# Patient Record
Sex: Female | Born: 1955 | Hispanic: No | Marital: Married
Health system: Southern US, Community
[De-identification: ages and names within clinical notes are randomized; demographics above are authoritative.]

---

## 2020-06-12 ENCOUNTER — Emergency Department (HOSPITAL_COMMUNITY)
Admission: EM | Admit: 2020-06-12 | Discharge: 2020-06-13 | Disposition: A | Discharge status: Home / Self Care | Payer: 59 | Attending: Emergency Medicine | Admitting: Emergency Medicine

## 2020-06-12 DIAGNOSIS — R52 Pain, unspecified: Secondary | ICD-10-CM

## 2020-06-12 DIAGNOSIS — Y999 Unspecified external cause status: Secondary | ICD-10-CM | POA: Diagnosis not present

## 2020-06-12 DIAGNOSIS — W19XXXA Unspecified fall, initial encounter: Secondary | ICD-10-CM

## 2020-06-12 DIAGNOSIS — S22060A Wedge compression fracture of T7-T8 vertebra, initial encounter for closed fracture: Secondary | ICD-10-CM | POA: Insufficient documentation

## 2020-06-12 DIAGNOSIS — N3000 Acute cystitis without hematuria: Secondary | ICD-10-CM | POA: Diagnosis not present

## 2020-06-12 DIAGNOSIS — R519 Headache, unspecified: Secondary | ICD-10-CM | POA: Insufficient documentation

## 2020-06-12 DIAGNOSIS — S299XXA Unspecified injury of thorax, initial encounter: Secondary | ICD-10-CM | POA: Diagnosis present

## 2020-06-12 DIAGNOSIS — Y9389 Activity, other specified: Secondary | ICD-10-CM | POA: Insufficient documentation

## 2020-06-12 DIAGNOSIS — E871 Hypo-osmolality and hyponatremia: Secondary | ICD-10-CM | POA: Insufficient documentation

## 2020-06-12 DIAGNOSIS — Y92002 Bathroom of unspecified non-institutional (private) residence single-family (private) house as the place of occurrence of the external cause: Secondary | ICD-10-CM | POA: Diagnosis not present

## 2020-06-12 DIAGNOSIS — W010XXA Fall on same level from slipping, tripping and stumbling without subsequent striking against object, initial encounter: Secondary | ICD-10-CM | POA: Insufficient documentation

## 2020-06-12 NOTE — ED Triage Notes (Signed)
Onset 4pm pt was walking out of BR, Fell backwards hitting back of head and back.  C/o head, back, upper left abd pain.  NO LOC, dizziness.  Pt not sure why she fell.

## 2020-06-13 ENCOUNTER — Emergency Department (HOSPITAL_COMMUNITY): Payer: 59

## 2020-06-13 LAB — URINALYSIS, ROUTINE W REFLEX MICROSCOPIC
Bilirubin Urine: NEGATIVE
Glucose, UA: NEGATIVE mg/dL
Hgb urine dipstick: NEGATIVE
Ketones, ur: NEGATIVE mg/dL
Nitrite: NEGATIVE
Protein, ur: NEGATIVE mg/dL
Specific Gravity, Urine: 1.011 (ref 1.005–1.030)
pH: 7 (ref 5.0–8.0)

## 2020-06-13 LAB — COMPREHENSIVE METABOLIC PANEL
ALT: 23 U/L (ref 0–44)
AST: 25 U/L (ref 15–41)
Albumin: 3.8 g/dL (ref 3.5–5.0)
Alkaline Phosphatase: 105 U/L (ref 38–126)
Anion gap: 11 (ref 5–15)
BUN: 12 mg/dL (ref 8–23)
CO2: 26 mmol/L (ref 22–32)
Calcium: 9.7 mg/dL (ref 8.9–10.3)
Chloride: 92 mmol/L — ABNORMAL LOW (ref 98–111)
Creatinine, Ser: 0.53 mg/dL (ref 0.44–1.00)
GFR calc Af Amer: >60 mL/min (ref >60)
GFR calc non Af Amer: >60 mL/min (ref >60)
Glucose, Bld: 113 mg/dL — ABNORMAL HIGH (ref 70–99)
Potassium: 3.7 mmol/L (ref 3.5–5.1)
Sodium: 129 mmol/L — ABNORMAL LOW (ref 135–145)
Total Bilirubin: 0.3 mg/dL (ref 0.3–1.2)
Total Protein: 7.5 g/dL (ref 6.5–8.1)

## 2020-06-13 LAB — CBC WITH DIFFERENTIAL/PLATELET
Abs Immature Granulocytes: 0.03 10*3/uL (ref 0.00–0.07)
Basophils Absolute: 0 10*3/uL (ref 0.0–0.1)
Basophils Relative: 0 %
Eosinophils Absolute: 0.1 10*3/uL (ref 0.0–0.5)
Eosinophils Relative: 1 %
HCT: 40 % (ref 36.0–46.0)
Hemoglobin: 13.3 g/dL (ref 12.0–15.0)
Immature Granulocytes: 0 %
Lymphocytes Relative: 31 %
Lymphs Abs: 3.3 10*3/uL (ref 0.7–4.0)
MCH: 30.5 pg (ref 26.0–34.0)
MCHC: 33.3 g/dL (ref 30.0–36.0)
MCV: 91.7 fL (ref 80.0–100.0)
Monocytes Absolute: 0.9 10*3/uL (ref 0.1–1.0)
Monocytes Relative: 9 %
Neutro Abs: 6.4 10*3/uL (ref 1.7–7.7)
Neutrophils Relative %: 59 %
Platelets: 400 10*3/uL (ref 150–400)
RBC: 4.36 MIL/uL (ref 3.87–5.11)
RDW: 12.8 % (ref 11.5–15.5)
WBC: 10.7 10*3/uL — ABNORMAL HIGH (ref 4.0–10.5)
nRBC: 0 % (ref 0.0–0.2)

## 2020-06-13 MED ORDER — ONDANSETRON 4 MG PO TBDP: 4.0000 mg | ORAL_TABLET | Freq: Three times a day (TID) | ORAL | 0 refills | Status: AC | PRN

## 2020-06-13 MED ORDER — OXYCODONE-ACETAMINOPHEN 5-325 MG PO TABS
1.0000 | ORAL_TABLET | Freq: Once | ORAL | Status: AC
  Administered 2020-06-13: 1 via ORAL
  Filled 2020-06-13: qty 1

## 2020-06-13 MED ORDER — OXYCODONE-ACETAMINOPHEN 5-325 MG PO TABS: 1.0000 | ORAL_TABLET | Freq: Three times a day (TID) | ORAL | 0 refills | Status: AC | PRN

## 2020-06-13 MED ORDER — ONDANSETRON 4 MG PO TBDP
4.0000 mg | ORAL_TABLET | Freq: Once | ORAL | Status: AC
  Administered 2020-06-13: 4 mg via ORAL
  Filled 2020-06-13: qty 1

## 2020-06-13 MED ORDER — CEPHALEXIN 500 MG PO CAPS: 500.0000 mg | ORAL_CAPSULE | Freq: Four times a day (QID) | ORAL | 0 refills | Status: AC

## 2020-06-13 NOTE — Discharge Instructions (Signed)
Thank you for allowing me to care for you today in the Emergency Department.   You were seen today in the emergency department for a fall.  Your imaging showed a fracture/broken bone of T-7 in your thoracic spine.    You can take 650 mg of Tylenol once every 6 hours for pain.  You can also take 1 tablet of Percocet every 8 hours for pain control.  If you develop nausea with Percocet, you can let 1 tablet of Zofran dissolve under your tongue every 8 hours as needed.  Please note, each tablet of Percocet contains 325 mg of Tylenol.  You should not take more than 4000 mg of Tylenol from all sources in a 24-hour period.  Lidocaine patches are available over-the-counter and can be applied to the skin to help with pain control as directed on the label.  You can also apply an ice pack for 15 to 20 minutes up to 3-4 times a day for the next 5 days help with pain.  You can follow-up with your primary care clinician if you continue to have severe pain.  Most fractures take 6 to 8 weeks to heal.  Your urine was also concerning for infection.  Take 1 tablet of Keflex every 6 hours for the next 5 days for UTI.  Your primary care clinician can also repeat a urine in the office to make sure this is resolved.  Your sodium levels in your blood was also slightly low.  Please have your primary care provider recheck this in the office in the next week.  Return to the emergency department if you have any fall or injury, fever, unable to walk, develop new numbness or weakness, develop severe, uncontrollable abdominal pain with high fevers, if you stop urinating, or develop other new, concerning symptoms.  ???? ?? ?????? ????????? ?????????????? ?????? ???? ?????? ??????? ????????  ????? ?? ????? ???? ???????? ??????? ?????? ???? ??????? ????? .?? ??????? ?????? ?????????? T-7 ?? ???????? / ???????? ????? ???????  ???? 650? ????????? ??????? ??? ?????????? ???????? hours ????? ??????? ????? ???? ??? ?? tablet ?????????  Percocet ??? ?????????? pain ????? ????? ??????????? ????? ??? ????? ??????????? ???? ????? ?????????? ???, ????? Zofran ?? ? ???????????? ???????? ?????? ???? under ??????? ??????? ?????? ????? ????? ??????????? ????? ??? ?????????, Percocet ?? ???????? ??????????? Tylenol ?? 5?5 ????????? ?????? ??????? ? sources ????? ?????? ?????? ??? ??????????? mg??? ????????? ????? ??? Tylenol ?????????? ??????? ???????? ???-?-??????? ?????? ??? ? ?????? ???????? ?? ?????? ????? ??????????? ????? ???? ?????? ???? ???? ??????? ????? 5 ??? ??????? ??? ??????? ???? ????? ? ice ???? ?? ????? ????? 3-4-? ??????? ??? ??? ????? ???? ???? ???????????  ??? ????? ?????? ????? ???? ??????????? ??? ????? ????? ???????? ?????? ???????????? ??? ?????? ???? ??????????? ??????? ????????????? ???? ??? to ???? weeks ????? ??????  ??????? ????? ????????? ???? ??? ????? ????? 5 ????? ???????? ???? ?????????? ? ????????? ???????? hours ????? ????????? ?????? ???????? ?????? ??????????? ??? ?? ?????? ???? ??????? ???? ?????? ???? ????? ?????  ??????? ????? ??????? ???????? ???? ??? ???? ?? ????? ????? ?????? ???????? ?????? ?????????? ????? ??????? ?????????? ???: ???? ??????????  ???????? ??????? ?????????? ??? ???????? ???? ??? ?? ??? ?, ?????, ?????? ??????, ???? ????? ?? ?????? ?????, ???? ?????? ??? ??????, ??????????? ??? ???? ?????, ??? ????? ????? ???? ?????? ???, ?? ??????? ?????? ???? ???? ????? ???????? ????

## 2020-06-13 NOTE — ED Notes (Signed)
Pt returned from Xray/CT at this time

## 2020-06-13 NOTE — ED Provider Notes (Signed)
[facility]   CSN: [id number] Arrival date & time: 06/12/20  1753     History Chief Complaint  Patient presents with  . Fall  . Back Pain  . Abdominal Pain    Morgan PattenKrishna Khumari Hermance is a 64 y.o. female with a history of hypertension and hyperlipidemia who presents to the emergency department with a chief complaint of unwitnessed fall that occurred just prior to arrival.  The patient reports that she was walking out of the restroom when she attempted to grab the door, thinking that it was locked, and the door swung open, causing her to fall backwards from standing.  She hit her head, but did not have a loss of consciousness, nausea, or vomiting.  States that she laid in the floor for approximately 5 minutes before her husband came and was able to assist her up from the floor and she was able to walk with his assistance to the bed.  Reports that she was "unable to speak" for a couple of minutes due to the intensity of the pain in her back and ribs.  No treatment prior to arrival.  The patient's daughter is at bedside in the ER and reports that she evaluated the patient within a couple of minutes of the fall and the patient has been speaking and acting appropriately since the episode.  In the ER, she is endorsing a headache and pain in the posterior scalp accompanied by neck pain, back pain, pelvis, left shoulder and upper arm pain.  States that the pain in her mid back radiates around bilaterally into her bilateral inferior ribs.  She has been intermittently feeling short of breath since the fall due to the pain, but only when she attempts to take a deep breath.  She denies any chest pain or shortness of breath prior to the fall.    No numbness, weakness, visual changes, slurred speech, facial droop, pain in her bilateral legs, left elbow wrist pain, right upper extremity pain.  She is also endorsing some lower abdominal pain, but states that  this was not present prior to the fall.  No nausea, vomiting, diarrhea, or constipation.  She has been having burning dysuria for a couple of days.  No vaginal complaints, fever, or chills.  She does not take any blood thinners.    The history is provided by the patient. The history is limited by a language barrier. A language interpreter was used [place]ali).       No past medical history on file.  There are no problems to display for this patient.   OB History   No obstetric history on file.     No family history on file.  Social History   Tobacco Use  . Smoking status: Not on file  Substance Use Topics  . Alcohol use: Not on file  . Drug use: Not on file    Home Medications Prior to Admission medications   Medication Sig Start Date End Date Taking? Authorizing Provider  cephALEXin (KEFLEX) 500 MG capsule Take 1 capsule (500 mg total) by mouth 4 (four) times daily. 06/13/20   Antwoin Lackey A, PA-C  ondansetron (ZOFRAN ODT) 4 MG disintegrating tablet Take 1 tablet (4 mg total) by mouth every 8 (eight) hours as needed. 06/13/20   Tandrea Kommer A, PA-C  oxyCODONE-acetaminophen (PERCOCET/ROXICET) 5-325 MG tablet Take 1 tablet by mouth every 8 (eight) hours as needed for severe pain. 06/13/20   Juley Giovanetti A, PA-C  Allergies    Patient has no allergy information on record.  Review of Systems   Review of Systems  Constitutional: Negative for activity change, chills and fever.  HENT: Negative for facial swelling.   Eyes: Negative for visual disturbance.  Respiratory: Negative for shortness of breath.   Cardiovascular: Positive for chest pain.  Gastrointestinal: Negative for abdominal pain, diarrhea, nausea and vomiting.  Genitourinary: Positive for dysuria and flank pain. Negative for hematuria.  Musculoskeletal: Positive for arthralgias, back pain, myalgias and neck pain. Negative for gait problem and neck stiffness.  Skin: Negative for rash and wound.    Allergic/Immunologic: Negative for immunocompromised state.  Neurological: Positive for headaches. Negative for dizziness, seizures, syncope, weakness and numbness.  Psychiatric/Behavioral: Negative for confusion.    Physical Exam Updated Vital Signs BP 124/85 (BP Location: Right Arm)   Pulse 65   Temp 98.6 F (37 C) (Oral)   Resp 17   Ht 4\' 7"  (1.397 m)   Wt 70.3 kg   SpO2 97%   BMI 36.03 kg/m   Physical Exam Vitals and nursing note reviewed.  Constitutional:      General: She is not in acute distress. HENT:     Head: Normocephalic. No raccoon eyes or Battle's sign.     Comments: Tender to palpation to the occiput.  No hematoma, wounds, deformities noted.    Right Ear: Tympanic membrane normal.     Left Ear: Tympanic membrane normal.  Eyes:     Conjunctiva/sclera: Conjunctivae normal.  Cardiovascular:     Rate and Rhythm: Normal rate and regular rhythm.     Pulses: Normal pulses.     Heart sounds: Normal heart sounds. No murmur heard.  No friction rub. No gallop.   Pulmonary:     Effort: Pulmonary effort is normal. No respiratory distress.     Breath sounds: No stridor. No wheezing, rhonchi or rales.     Comments: Tenderness to palpation to the bilateral anterolateral inferior ribs.  No crepitus or step-offs.  No tenderness to the sternum.  Bilateral clavicles are nontender. Breath sounds auscultated and clear in all fields.  Chest:     Chest wall: Tenderness present.  Abdominal:     General: There is no distension.     Palpations: Abdomen is soft. There is no mass.     Tenderness: There is abdominal tenderness. There is no right CVA tenderness, left CVA tenderness, guarding or rebound.     Hernia: No hernia is present.     Comments: Tenderness to palpation of the suprapubic region. Upper abdomen is nontender throughout.  No CVA tenderness bilaterally.  No rebound or guarding.  No bruising or wounds noted throughout the abdominal wall.  Musculoskeletal:         General: Tenderness present.     Cervical back: Neck supple.     Right lower leg: No edema.     Left lower leg: No edema.     Comments: No focal tenderness to the spinous processes of the cervical spine.  There is point tenderness to the spinous processes of the thoracic and lumbar spine.  There is also tenderness to the bilateral posterior pelvis.  No crepitus or step-offs noted throughout.  No overlying bruising or wounds noted to the neck, spine, or pelvis.  No tenderness to the bilateral hips, knees, or ankles.  No pain with internal or external rotation.  No shortening noted to the bilateral lower extremities.  Full active and passive range of motion is intact.  Tender to palpation over the left shoulder.  Increased pain with passive range of motion.  There is also point tenderness to the left proximal humerus.  Normal exam of the left elbow, wrist, hands, and fingers.  Normal exam of the right upper extremity.    Skin:    General: Skin is warm.     Findings: No rash.  Neurological:     Mental Status: She is alert.     Comments: Alert and oriented x4.  GCS 15.  Cranial nerves II through XII are grossly intact.  5 out of 5 strength against resistance of the large muscle groups of the bilateral lower.  4-5 strength against resistance of the left upper extremity as compared to the right secondary to pain.  Sensation is intact and equal throughout.  No dysmetria.  Follows simple commands.  Gait exam deferred at this time secondary to pain.  No pronator drift.  Psychiatric:        Behavior: Behavior normal.     ED Results / Procedures / Treatments   Labs (all labs ordered are listed, but only abnormal results are displayed) Labs Reviewed  URINALYSIS, ROUTINE W REFLEX MICROSCOPIC - Abnormal; Notable for the following components:      Result Value   APPearance HAZY (*)    Leukocytes,Ua MODERATE (*)    Bacteria, UA RARE (*)    All other components within normal limits  CBC WITH  DIFFERENTIAL/PLATELET - Abnormal; Notable for the following components:   WBC 10.7 (*)    All other components within normal limits  COMPREHENSIVE METABOLIC PANEL - Abnormal; Notable for the following components:   Sodium 129 (*)    Chloride 92 (*)    Glucose, Bld 113 (*)    All other components within normal limits  URINE CULTURE    EKG EKG Interpretation  Date/Time:  Monday June 13 2020 01:04:58 EDT Ventricular Rate:  75 PR Interval:    QRS Duration: 80 QT Interval:  365 QTC Calculation: 408 R Axis:   40 Text Interpretation: Sinus rhythm Abnormal R-wave progression, early transition Confirmed by Rochele Raring [phone]) on 06/13/2020 2:20:14 AM   Radiology DG Chest 2 View  Result Date: 06/13/2020 CLINICAL DATA:  Fall backwards from standing onto back.  Pain. EXAM: CHEST - 2 VIEW COMPARISON:  Concurrent thoracic spine radiograph. FINDINGS: Mild cardiomegaly. Normal mediastinal contours. No pneumothorax, large pleural effusion, or focal airspace disease. Thoracic compression fracture on dedicated thoracic spine is not well visualized on the current exam. No obvious sternal or rib fractures. IMPRESSION: Mild cardiomegaly. Electronically Signed   By: Narda Rutherford M.D.   On: 06/13/2020 02:55   DG Thoracic Spine 2 View  Result Date: 06/13/2020 CLINICAL DATA:  Fall backwards from standing onto back. Thoracic back pain. EXAM: THORACIC SPINE 2 VIEWS COMPARISON:  None. FINDINGS: Mild T7 compression fracture is age indeterminate. Alignment is maintained. Mild degenerative disc disease. Bones are under mineralized. There is no paravertebral soft tissue abnormality. IMPRESSION: Age indeterminate mild T7 compression fracture. Electronically Signed   By: Narda Rutherford M.D.   On: 06/13/2020 02:51   DG Lumbar Spine Complete  Result Date: 06/13/2020 CLINICAL DATA:  Fall backwards from standing onto back. Lumbosacral back pain. EXAM: LUMBAR SPINE - COMPLETE 4+ VIEW COMPARISON:  None. FINDINGS:  Lateral view significantly limited due to patient rotation/positioning. There are 4 non-rib-bearing lumbar vertebra. No evidence of acute fracture, however detailed evaluation is limited. Multilevel endplate spurring with mild disc space narrowing at the thoracolumbar  junction. Bones are under mineralized. The sacroiliac joints are congruent. IMPRESSION: Limited evaluation of the lumbar spine due to positioning. Allowing for this, no evidence of acute fracture. Electronically Signed   By: Narda Rutherford M.D.   On: 06/13/2020 02:52   DG Pelvis 1-2 Views  Result Date: 06/13/2020 CLINICAL DATA:  Fall backwards from standing onto back.  Pain. EXAM: PELVIS - 1-2 VIEW COMPARISON:  None. FINDINGS: The cortical margins of the bony pelvis are intact. No fracture. Pubic symphysis and sacroiliac joints are congruent. Both femoral heads are well-seated in the respective acetabula. IMPRESSION: No evidence of pelvic fracture. Electronically Signed   By: Narda Rutherford M.D.   On: 06/13/2020 02:55   CT Head Wo Contrast  Result Date: 06/13/2020 CLINICAL DATA:  Fall EXAM: CT HEAD WITHOUT CONTRAST CT CERVICAL SPINE WITHOUT CONTRAST TECHNIQUE: Multidetector CT imaging of the head and cervical spine was performed following the standard protocol without intravenous contrast. Multiplanar CT image reconstructions of the cervical spine were also generated. COMPARISON:  None. FINDINGS: CT HEAD FINDINGS Brain: There is no mass, hemorrhage or extra-axial collection. The size and configuration of the ventricles and extra-axial CSF spaces are normal. The brain parenchyma is normal, without evidence of acute or chronic infarction. Vascular: No abnormal hyperdensity of the major intracranial arteries or dural venous sinuses. No intracranial atherosclerosis. Skull: The visualized skull base, calvarium and extracranial soft tissues are normal. Sinuses/Orbits: No fluid levels or advanced mucosal thickening of the visualized paranasal  sinuses. No mastoid or middle ear effusion. The orbits are normal. CT CERVICAL SPINE FINDINGS Alignment: No static subluxation. Facets are aligned. Occipital condyles are normally positioned. Skull base and vertebrae: No acute fracture. Soft tissues and spinal canal: No prevertebral fluid or swelling. No visible canal hematoma. Disc levels: No advanced spinal canal or neural foraminal stenosis. Upper chest: No pneumothorax, pulmonary nodule or pleural effusion. Other: Normal visualized paraspinal cervical soft tissues. IMPRESSION: 1. No acute intracranial abnormality. 2. No acute fracture or static subluxation of the cervical spine. Electronically Signed   By: Deatra Robinson M.D.   On: 06/13/2020 03:05   CT Cervical Spine Wo Contrast  Result Date: 06/13/2020 CLINICAL DATA:  Fall EXAM: CT HEAD WITHOUT CONTRAST CT CERVICAL SPINE WITHOUT CONTRAST TECHNIQUE: Multidetector CT imaging of the head and cervical spine was performed following the standard protocol without intravenous contrast. Multiplanar CT image reconstructions of the cervical spine were also generated. COMPARISON:  None. FINDINGS: CT HEAD FINDINGS Brain: There is no mass, hemorrhage or extra-axial collection. The size and configuration of the ventricles and extra-axial CSF spaces are normal. The brain parenchyma is normal, without evidence of acute or chronic infarction. Vascular: No abnormal hyperdensity of the major intracranial arteries or dural venous sinuses. No intracranial atherosclerosis. Skull: The visualized skull base, calvarium and extracranial soft tissues are normal. Sinuses/Orbits: No fluid levels or advanced mucosal thickening of the visualized paranasal sinuses. No mastoid or middle ear effusion. The orbits are normal. CT CERVICAL SPINE FINDINGS Alignment: No static subluxation. Facets are aligned. Occipital condyles are normally positioned. Skull base and vertebrae: No acute fracture. Soft tissues and spinal canal: No prevertebral  fluid or swelling. No visible canal hematoma. Disc levels: No advanced spinal canal or neural foraminal stenosis. Upper chest: No pneumothorax, pulmonary nodule or pleural effusion. Other: Normal visualized paraspinal cervical soft tissues. IMPRESSION: 1. No acute intracranial abnormality. 2. No acute fracture or static subluxation of the cervical spine. Electronically Signed   By: Deatra Robinson M.D.   On:  06/13/2020 03:05   DG Shoulder Left  Result Date: 06/13/2020 CLINICAL DATA:  Fall backwards from standing onto back. Left shoulder and arm pain. EXAM: LEFT SHOULDER - 2+ VIEW COMPARISON:  None. FINDINGS: There is no evidence of fracture or dislocation. Majority of the humerus is included, no humeral fracture where visualized. There is no evidence of arthropathy or other focal bone abnormality. Soft tissues are unremarkable. IMPRESSION: No fracture of the left shoulder or included humerus. Electronically Signed   By: Narda Rutherford M.D.   On: 06/13/2020 02:54    Procedures Procedures (including critical care time)  Medications Ordered in ED Medications  oxyCODONE-acetaminophen (PERCOCET/ROXICET) 5-325 MG per tablet 1 tablet (1 tablet Oral Given 06/13/20 0154)  ondansetron (ZOFRAN-ODT) disintegrating tablet 4 mg (4 mg Oral Given 06/13/20 0154)    ED Course  I have reviewed the triage vital signs and the nursing notes.  Pertinent labs & imaging results that were available during my care of the patient were reviewed by me and considered in my medical decision making (see chart for details).    MDM Rules/Calculators/A&P                          64 year old female with a history of hypertension and hyperlipidemia presenting from home after a mechanical fall from standing earlier tonight.  She was reaching for what she presumed to be a locked door that unfortunately it was not locked and opened, which caused her to fall backwards from standing onto the floor.  No LOC, nausea, or vomiting.  She  states that she was "unable to speak" for a couple of minutes, but clarifies that this was secondary to pain and denies dysarthria and aphasia.  The patient's daughter evaluated the patient within minutes after the fall reports that she was speaking and communicating at baseline.  She is not anticoagulated.  On exam, she is tender to palpation to the posterior scalp, thoracic and lumbar spine, posterior pelvis, left shoulder, and has point tenderness to the left proximal humerus.  There is also tenderness to the bilateral inferior ribs.  Will give pain medication and order imaging.  She is also endorsing some lower abdominal tenderness, but states that this was present prior to her fall tonight.  She has been having dysuria and UA has been obtained.  We will also check basic labs.  Imaging is positive for T7 compression fracture, which I suspect is acute as she has tenderness to palpation over T7 on exam.  CT head is negative.  X-rays have been reviewed by me and are otherwise unremarkable.  Urinalysis is concerning for infection.  Urine culture sent.  We will start the patient on Keflex.  She also has a mild hyponatremia of uncertain etiology.  This could be secondary to her home hydrochlorothiazide, but after discussing with Dr. Elesa Massed, any physician, patient can follow-up with primary care for repeat metabolic panel.  She does have a mild leukocytosis that could be attributed to her UTI.  Doubt diverticulitis, bowel obstruction and dedicated imaging of the abdomen is not indicated at this time.  On reevaluation, patient is feeling much improved.  She was ambulated without difficulty in the emergency department.  Will discharge home with pain medication and Keflex.  TLSO brace is not indicated at this time.  She can follow closely with her primary care clinician.  She is hemodynamically stable and in no acute distress.  Safe for discharge home with outpatient follow-up as  indicated.  Final Clinical  Impression(s) / ED Diagnoses Final diagnoses:  Fall, initial encounter  Compression fracture of T7 vertebra, initial encounter ([place])  Acute cystitis without hematuria  Hyponatremia    Rx / DC Orders ED Discharge Orders         Ordered    oxyCODONE-acetaminophen (PERCOCET/ROXICET) 5-325 MG tablet  Every 8 hours PRN     Discontinue  Reprint     06/13/20 0543    ondansetron (ZOFRAN ODT) 4 MG disintegrating tablet  Every 8 hours PRN     Discontinue  Reprint     06/13/20 0543    cephALEXin (KEFLEX) 500 MG capsule  4 times daily     Discontinue  Reprint     06/13/20 0543           Charleston Hankin A, PA-C 06/13/20 0553    Ward, Delice Bison, DO 06/13/20 0488

## 2020-06-13 NOTE — ED Notes (Signed)
Patient verbalizes understanding of discharge instructions. Opportunity for questioning and answers were provided. Armband removed by staff, pt discharged from ED stable & ambulatory  

## 2020-06-13 NOTE — ED Notes (Signed)
Pt transported to Xray via stretcher

## 2020-06-14 LAB — URINE CULTURE

## 2020-11-22 IMAGING — CR DG LUMBAR SPINE COMPLETE 4+V
5 series · 5 of 5 positions shown · non-contrast
Comparison: None.

CLINICAL DATA: Fall backwards from standing onto back. Lumbosacral
back pain.

EXAM:
LUMBAR SPINE - COMPLETE 4+ VIEW

[l-spine obl (1 of 2)]
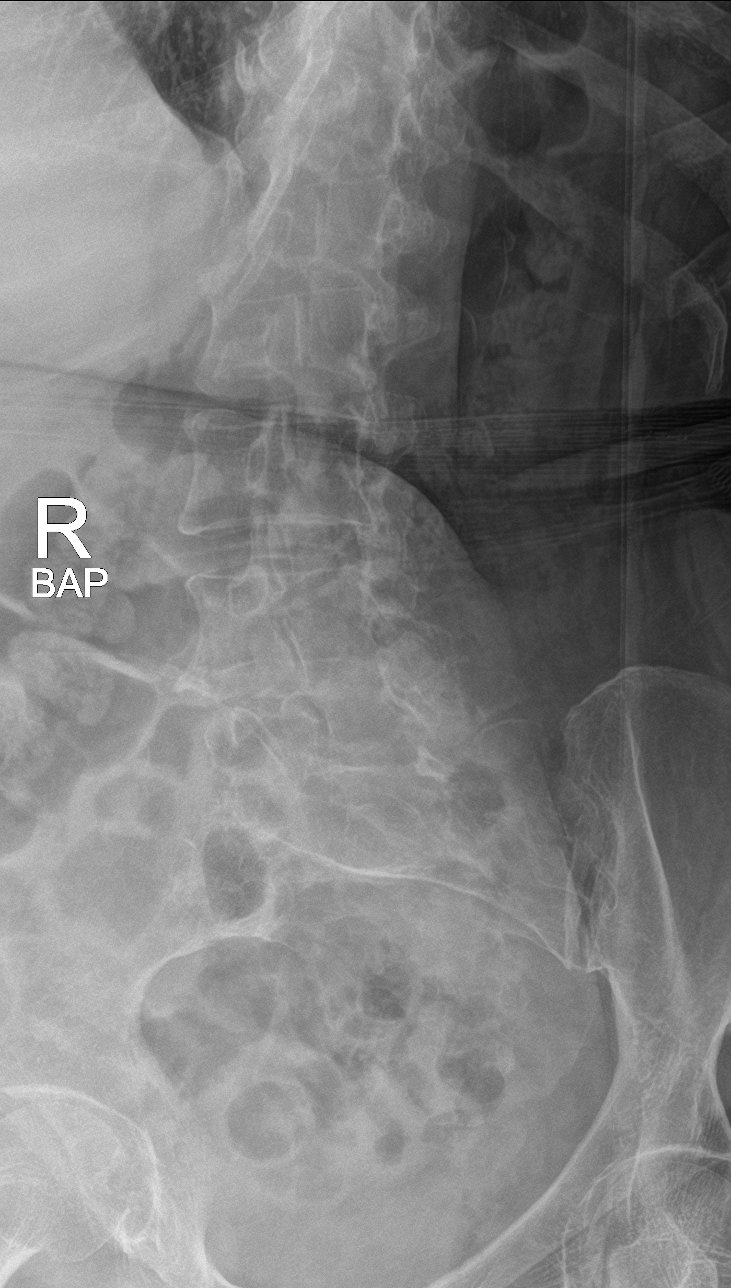

[l-spine obl (2 of 2)]
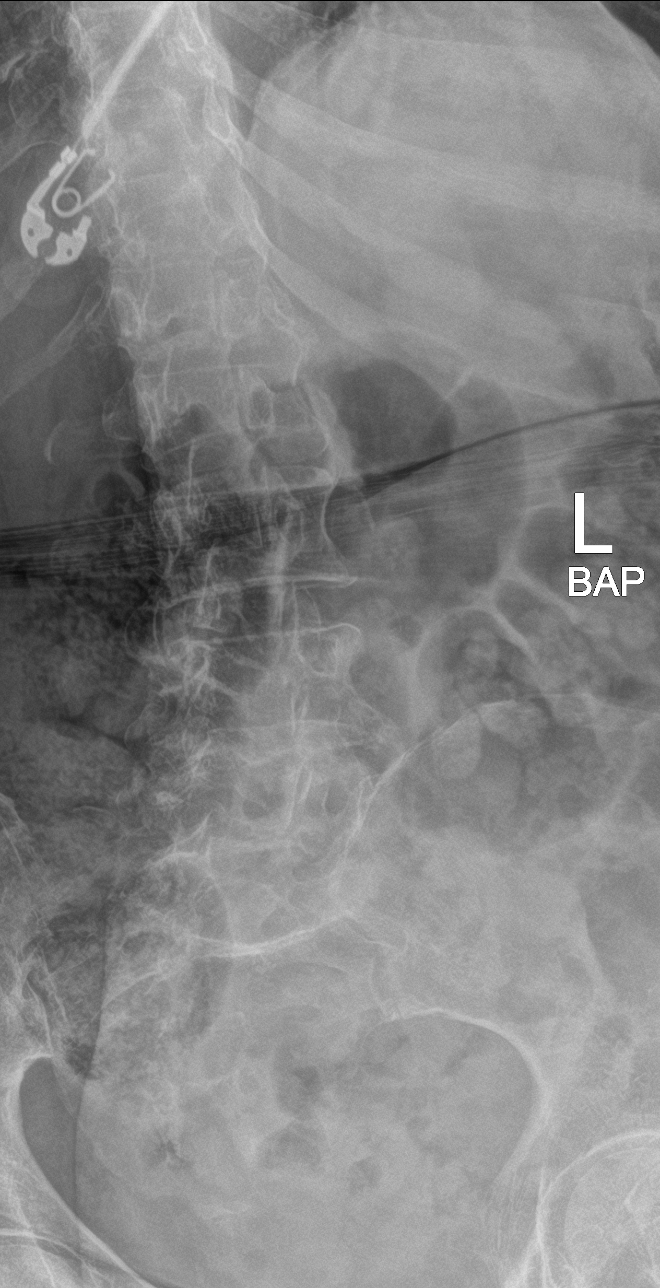

[l-spine lat]
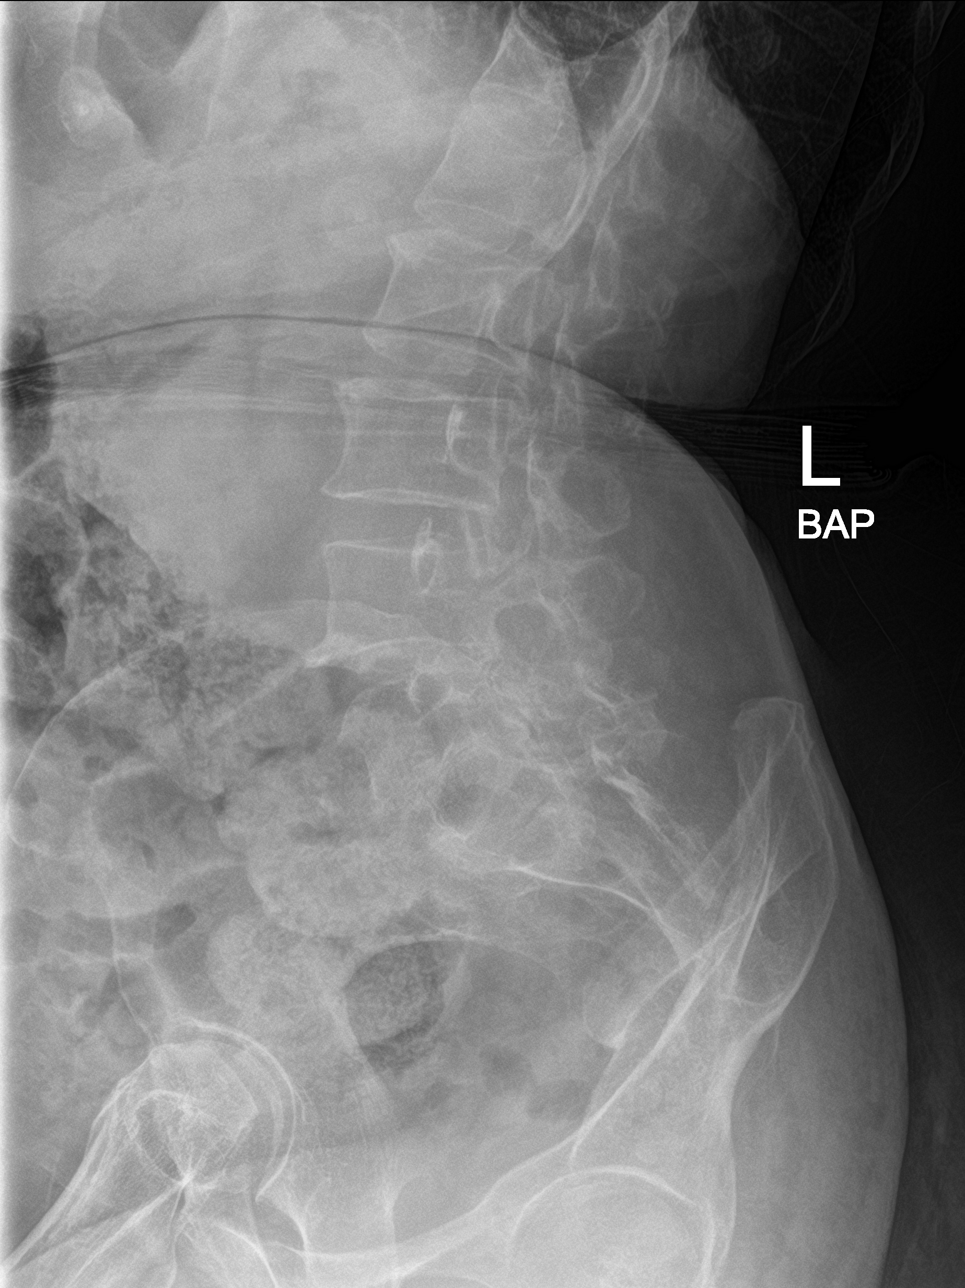

[l-spine ap]
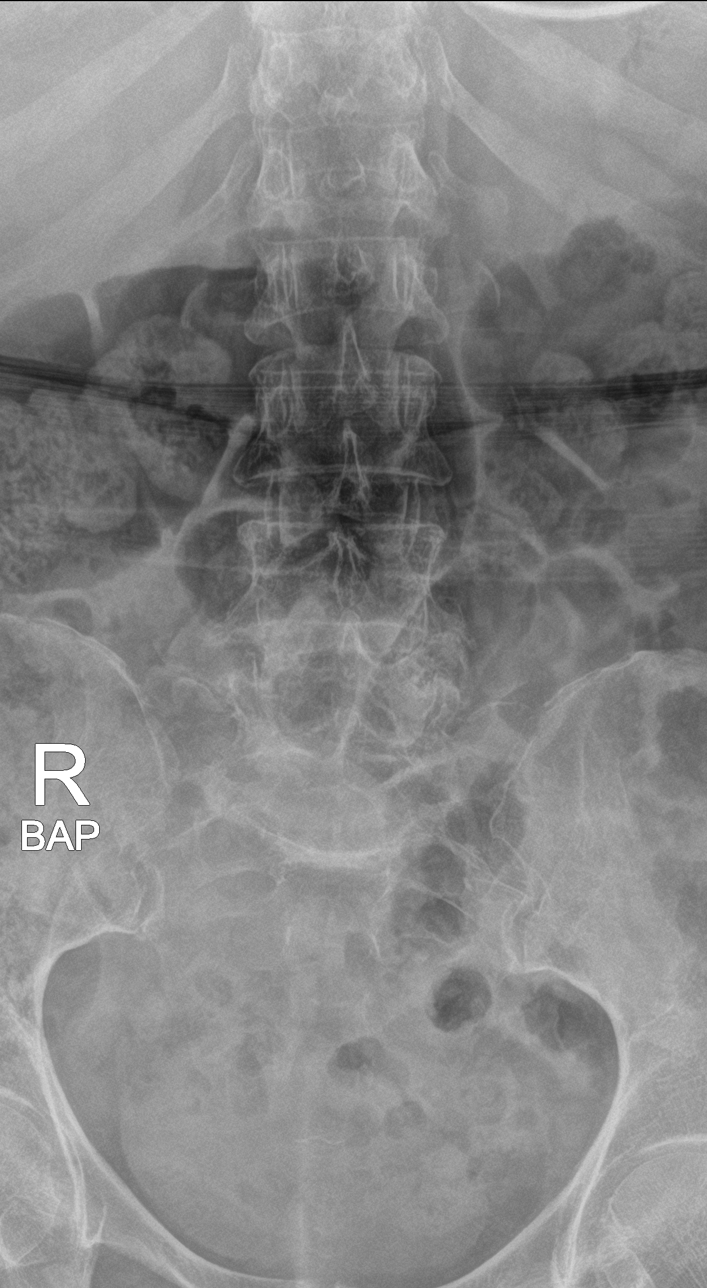

[l-spine spot]
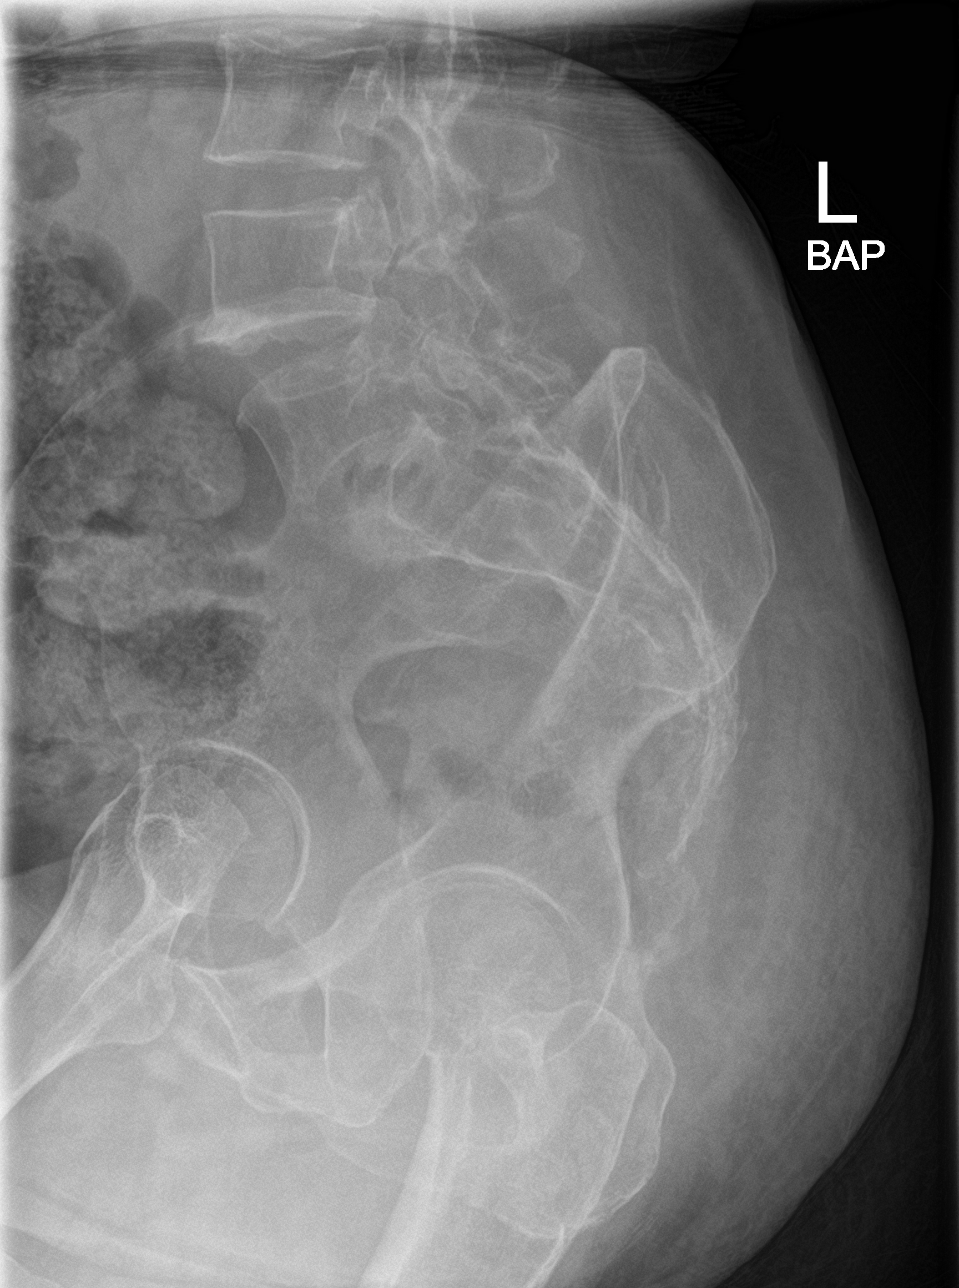

[5 of 5 positions shown; findings below may reference images not displayed]

FINDINGS: Lateral view significantly limited due to patient
rotation/positioning. There are 4 non-rib-bearing lumbar vertebra.
No evidence of acute fracture, however detailed evaluation is
limited. Multilevel endplate spurring with mild disc space narrowing
at the thoracolumbar junction. Bones are under mineralized. The
sacroiliac joints are congruent.
IMPRESSION: Limited evaluation of the lumbar spine due to positioning. Allowing
for this, no evidence of acute fracture.
